# Patient Record
Sex: Male | Born: 1995
Health system: Southern US, Community
[De-identification: ages and names within clinical notes are randomized; demographics above are authoritative.]

## PROBLEM LIST (undated history)

## (undated) DIAGNOSIS — L309 Dermatitis, unspecified: Secondary | ICD-10-CM

## (undated) DIAGNOSIS — F909 Attention-deficit hyperactivity disorder, unspecified type: Secondary | ICD-10-CM

## (undated) DIAGNOSIS — J45909 Unspecified asthma, uncomplicated: Secondary | ICD-10-CM

## (undated) HISTORY — DX: Attention-deficit hyperactivity disorder, unspecified type: F90.9

---
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
## 2017-01-21 IMAGING — CR DG CHEST 2V
2 series · 2 of 2 positions shown · non-contrast
Comparison: Chest radiograph performed 04/19/2011

CLINICAL DATA: Acute onset of left-sided chest pain. Initial
encounter.

EXAM:
CHEST  2 VIEW

[chest pa]
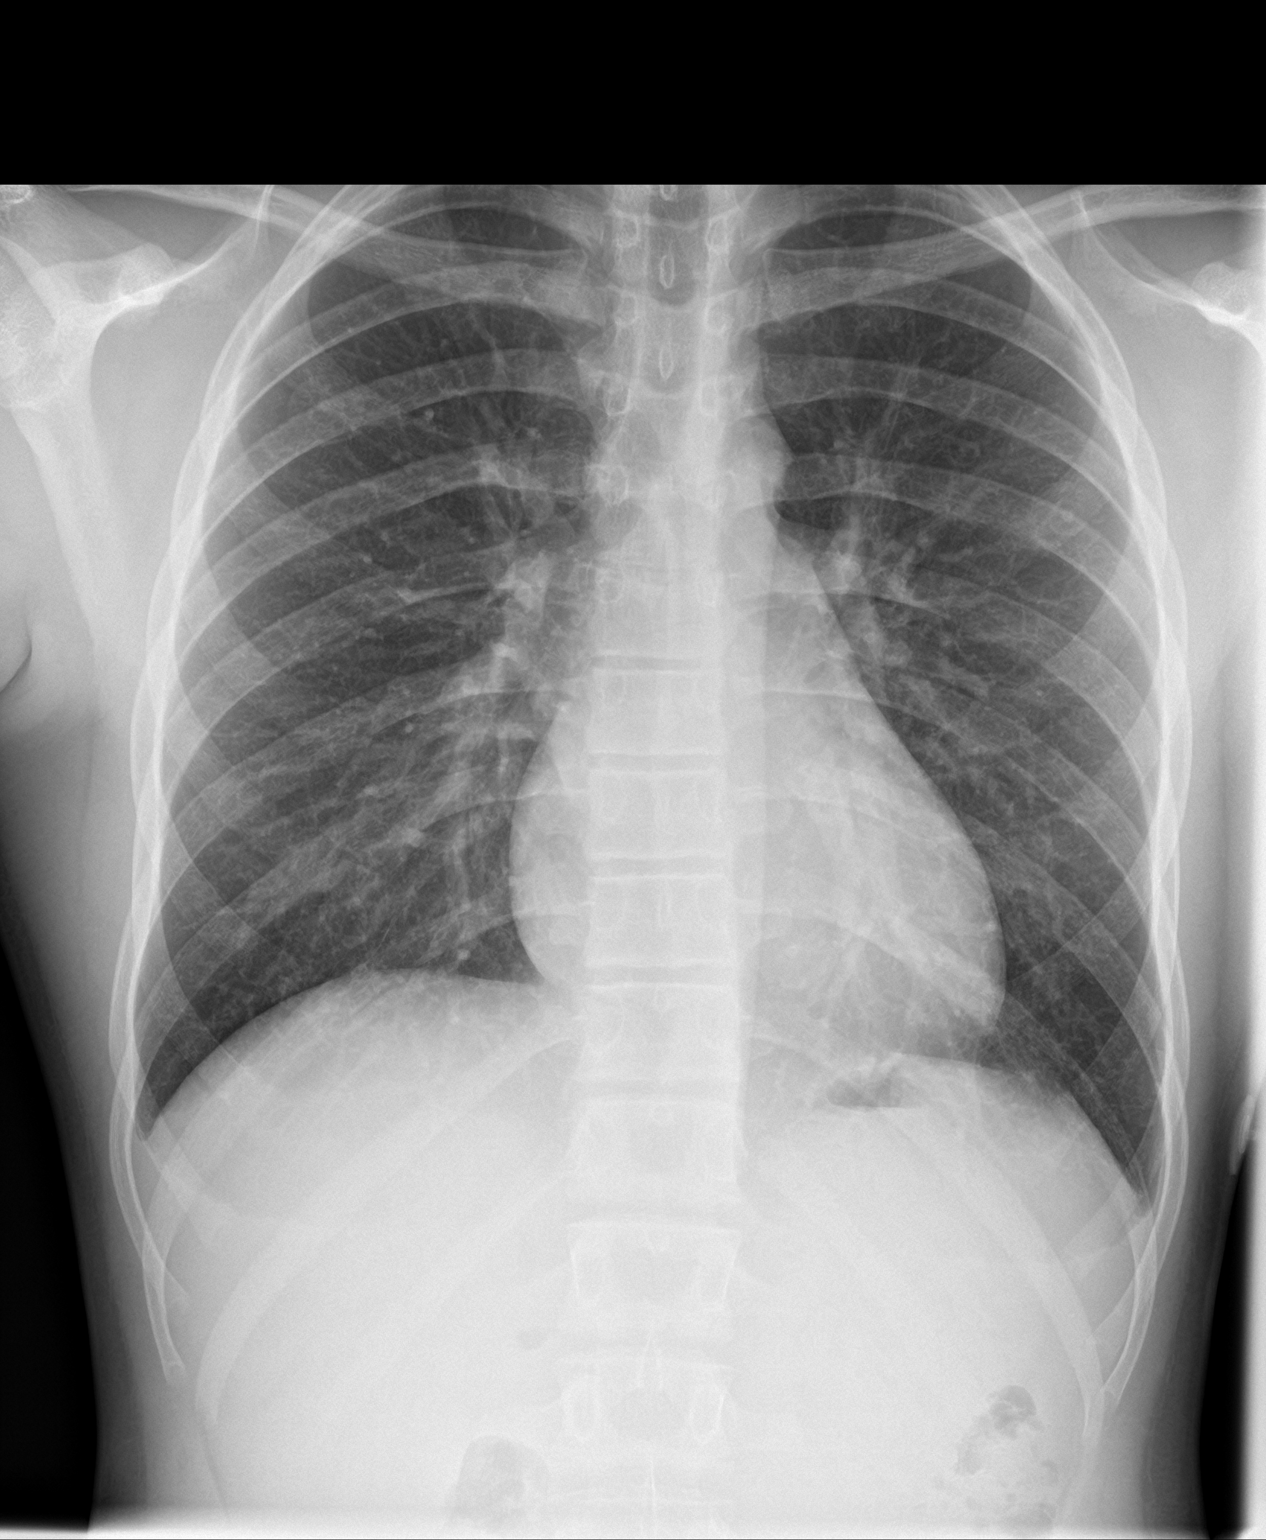

[chest lat]
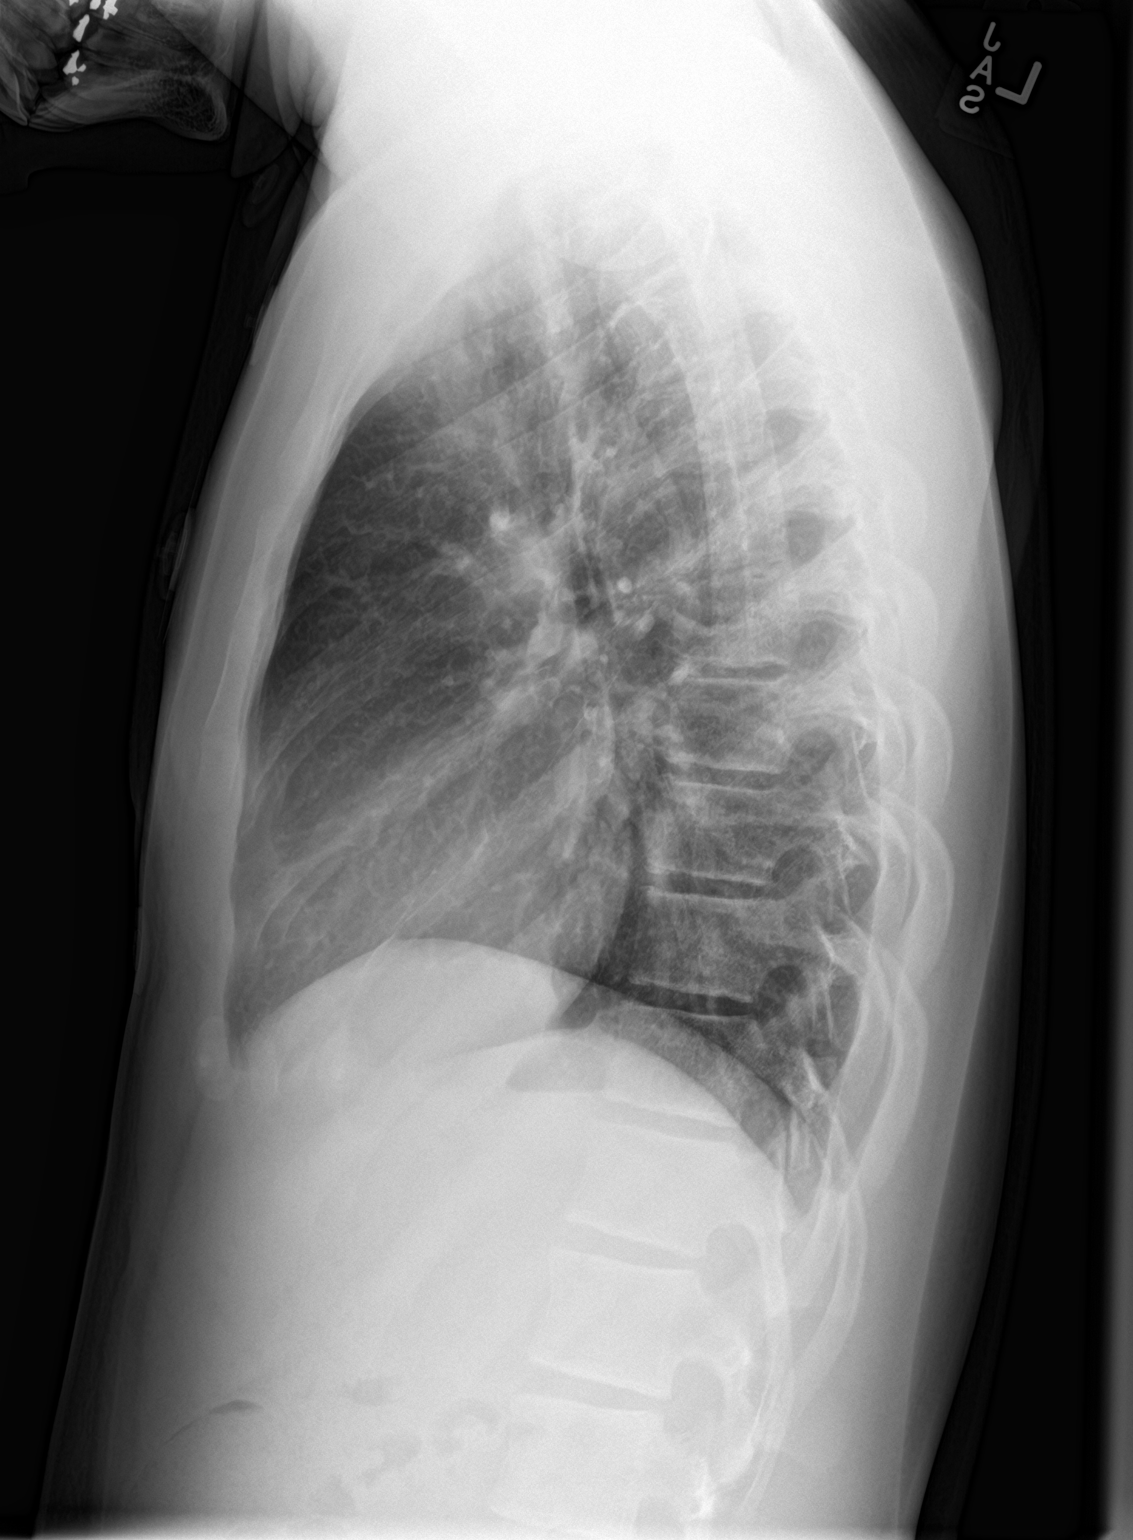

[2 of 2 positions shown; findings below may reference images not displayed]

FINDINGS: The lungs are well-aerated and clear. There is no evidence of focal
opacification, pleural effusion or pneumothorax.

The heart is normal in size; the mediastinal contour is within
normal limits. No acute osseous abnormalities are seen.
IMPRESSION: No acute cardiopulmonary process seen.

## 2017-01-26 DIAGNOSIS — Z23 Encounter for immunization: Secondary | ICD-10-CM | POA: Diagnosis not present

## 2017-01-26 DIAGNOSIS — L2084 Intrinsic (allergic) eczema: Secondary | ICD-10-CM | POA: Diagnosis not present

## 2017-01-26 DIAGNOSIS — Z0000 Encounter for general adult medical examination without abnormal findings: Secondary | ICD-10-CM | POA: Diagnosis not present

## 2017-02-26 DIAGNOSIS — Z23 Encounter for immunization: Secondary | ICD-10-CM | POA: Diagnosis not present

## 2017-04-16 DIAGNOSIS — J309 Allergic rhinitis, unspecified: Secondary | ICD-10-CM | POA: Diagnosis not present

## 2017-04-16 DIAGNOSIS — Z23 Encounter for immunization: Secondary | ICD-10-CM | POA: Diagnosis not present

## 2017-05-12 DIAGNOSIS — J309 Allergic rhinitis, unspecified: Secondary | ICD-10-CM | POA: Diagnosis not present

## 2017-06-08 DIAGNOSIS — Z973 Presence of spectacles and contact lenses: Secondary | ICD-10-CM | POA: Diagnosis not present

## 2017-06-08 DIAGNOSIS — H04123 Dry eye syndrome of bilateral lacrimal glands: Secondary | ICD-10-CM | POA: Diagnosis not present

## 2017-06-08 DIAGNOSIS — H5213 Myopia, bilateral: Secondary | ICD-10-CM | POA: Diagnosis not present

## 2017-08-11 DIAGNOSIS — J309 Allergic rhinitis, unspecified: Secondary | ICD-10-CM | POA: Diagnosis not present

## 2017-12-15 DIAGNOSIS — S6992XA Unspecified injury of left wrist, hand and finger(s), initial encounter: Secondary | ICD-10-CM | POA: Diagnosis not present

## 2017-12-15 DIAGNOSIS — M79645 Pain in left finger(s): Secondary | ICD-10-CM | POA: Diagnosis not present

## 2017-12-17 DIAGNOSIS — S6992XA Unspecified injury of left wrist, hand and finger(s), initial encounter: Secondary | ICD-10-CM | POA: Diagnosis not present

## 2018-02-25 DIAGNOSIS — Z23 Encounter for immunization: Secondary | ICD-10-CM | POA: Diagnosis not present

## 2018-02-25 DIAGNOSIS — Z6825 Body mass index (BMI) 25.0-25.9, adult: Secondary | ICD-10-CM | POA: Diagnosis not present

## 2018-05-06 DIAGNOSIS — Z139 Encounter for screening, unspecified: Secondary | ICD-10-CM | POA: Diagnosis not present

## 2018-05-06 DIAGNOSIS — Z23 Encounter for immunization: Secondary | ICD-10-CM | POA: Diagnosis not present

## 2018-05-06 DIAGNOSIS — Z6823 Body mass index (BMI) 23.0-23.9, adult: Secondary | ICD-10-CM | POA: Diagnosis not present

## 2018-05-06 DIAGNOSIS — Z118 Encounter for screening for other infectious and parasitic diseases: Secondary | ICD-10-CM | POA: Diagnosis not present

## 2018-05-27 DIAGNOSIS — Z6822 Body mass index (BMI) 22.0-22.9, adult: Secondary | ICD-10-CM | POA: Diagnosis not present

## 2018-05-27 DIAGNOSIS — Z0000 Encounter for general adult medical examination without abnormal findings: Secondary | ICD-10-CM | POA: Diagnosis not present

## 2018-06-10 DIAGNOSIS — Z973 Presence of spectacles and contact lenses: Secondary | ICD-10-CM | POA: Diagnosis not present

## 2018-06-10 DIAGNOSIS — H5213 Myopia, bilateral: Secondary | ICD-10-CM | POA: Diagnosis not present

## 2018-06-10 DIAGNOSIS — H04123 Dry eye syndrome of bilateral lacrimal glands: Secondary | ICD-10-CM | POA: Diagnosis not present

## 2018-07-01 DIAGNOSIS — Z23 Encounter for immunization: Secondary | ICD-10-CM | POA: Diagnosis not present

## 2018-07-01 DIAGNOSIS — Z0000 Encounter for general adult medical examination without abnormal findings: Secondary | ICD-10-CM | POA: Diagnosis not present

## 2018-07-01 DIAGNOSIS — Z6821 Body mass index (BMI) 21.0-21.9, adult: Secondary | ICD-10-CM | POA: Diagnosis not present

## 2018-07-08 DIAGNOSIS — M9901 Segmental and somatic dysfunction of cervical region: Secondary | ICD-10-CM | POA: Diagnosis not present

## 2018-07-08 DIAGNOSIS — R293 Abnormal posture: Secondary | ICD-10-CM | POA: Diagnosis not present

## 2018-07-08 DIAGNOSIS — M21752 Unequal limb length (acquired), left femur: Secondary | ICD-10-CM | POA: Diagnosis not present

## 2018-07-13 DIAGNOSIS — M21752 Unequal limb length (acquired), left femur: Secondary | ICD-10-CM | POA: Diagnosis not present

## 2018-07-13 DIAGNOSIS — R293 Abnormal posture: Secondary | ICD-10-CM | POA: Diagnosis not present

## 2018-07-13 DIAGNOSIS — M9901 Segmental and somatic dysfunction of cervical region: Secondary | ICD-10-CM | POA: Diagnosis not present

## 2018-10-14 DIAGNOSIS — J309 Allergic rhinitis, unspecified: Secondary | ICD-10-CM | POA: Diagnosis not present

## 2018-10-14 DIAGNOSIS — Z719 Counseling, unspecified: Secondary | ICD-10-CM | POA: Diagnosis not present

## 2019-01-13 ENCOUNTER — Other Ambulatory Visit: Payer: Self-pay

## 2019-01-13 DIAGNOSIS — Z20822 Contact with and (suspected) exposure to covid-19: Secondary | ICD-10-CM

## 2019-01-17 LAB — NOVEL CORONAVIRUS, NAA: SARS-CoV-2, NAA: NOT DETECTED

## 2019-01-24 ENCOUNTER — Telehealth: Payer: Self-pay

## 2019-01-24 NOTE — Telephone Encounter (Signed)
Called and informed patient that test for Covid 19 was NEGATIVE. Discussed signs and symptoms of Covid 19 : fever, chills, respiratory symptoms, cough, ENT symptoms, sore throat, SOB, muscle pain, diarrhea, headache, loss of taste/smell, close exposure to COVID-19 patient. Pt instructed to call PCP if they develop the above signs and sx. Pt also instructed to call 911 if having respiratory issues/distress. Discussed MyChart enrollment. Pt verbalized understanding.  

## 2019-07-13 ENCOUNTER — Ambulatory Visit: Payer: 59 | Attending: Internal Medicine

## 2019-07-13 DIAGNOSIS — Z20822 Contact with and (suspected) exposure to covid-19: Secondary | ICD-10-CM

## 2019-07-14 LAB — NOVEL CORONAVIRUS, NAA: SARS-CoV-2, NAA: NOT DETECTED

## 2019-12-25 ENCOUNTER — Other Ambulatory Visit: Payer: Self-pay

## 2019-12-25 ENCOUNTER — Encounter (HOSPITAL_COMMUNITY): Payer: Self-pay

## 2019-12-25 ENCOUNTER — Ambulatory Visit (HOSPITAL_COMMUNITY)
Admission: EM | Admit: 2019-12-25 | Discharge: 2019-12-25 | Disposition: A | Discharge status: Home / Self Care | Payer: 59

## 2019-12-25 DIAGNOSIS — M25561 Pain in right knee: Secondary | ICD-10-CM

## 2019-12-25 HISTORY — DX: Attention-deficit hyperactivity disorder, unspecified type: F90.9

## 2019-12-25 NOTE — ED Provider Notes (Signed)
[facility]   [id number] 12/25/19 Arrival Time: 1319  GM:WNUUV PAIN  SUBJECTIVE: History from: patient. Jason May is a 24 y.o. male complains of right knee pain that began about 3 weeks ago.  Reports that the right knee was swollen, and that it hurt to bear weight when the injury first occurred.  Reports that he is been using ibuprofen with little relief.  Reports that he hears popping in the knee when he is getting up or sitting down.  Reports that lateral movements are painful as well.  Reports that he was playing basketball and noticed once he was done.  Denies a specific movement or time where the pain began Denies fever, chills, erythema, ecchymosis, effusion, weakness, numbness and tingling, saddle paresthesias, loss of bowel or bladder function.      ROS: As per HPI.  All other pertinent ROS negative.     Past Medical History:  Diagnosis Date  . ADHD   . Asthma   . Eczema    History reviewed. No pertinent surgical history. No Known Allergies No current facility-administered medications on file prior to encounter.   Current Outpatient Medications on File Prior to Encounter  Medication Sig Dispense Refill  . amphetamine-dextroamphetamine (ADDERALL XR) 20 MG 24 hr capsule Take by mouth.    Marland Kitchen omeprazole (PRILOSEC) 20 MG capsule Take 1 capsule (20 mg total) by mouth daily. 14 capsule 0   Social History   Socioeconomic History  . Marital status: Single    Spouse name: Not on file  . Number of children: Not on file  . Years of education: Not on file  . Highest education level: Not on file  Occupational History  . Not on file  Tobacco Use  . Smoking status: Never Smoker  . Smokeless tobacco: Never Used  Vaping Use  . Vaping Use: Never used  Substance and Sexual Activity  . Alcohol use: Yes    Comment: occasional  . Drug use: Yes    Types: Marijuana  . Sexual activity: Yes    Birth control/protection: Condom  Other Topics Concern  . Not on file    Social History Narrative  . Not on file   Social Determinants of Health   Financial Resource Strain:   . Difficulty of Paying Living Expenses:   Food Insecurity:   . Worried About Programme researcher, broadcasting/film/video in the Last Year:   . Barista in the Last Year:   Transportation Needs:   . Freight forwarder (Medical):   Marland Kitchen Lack of Transportation (Non-Medical):   Physical Activity:   . Days of Exercise per Week:   . Minutes of Exercise per Session:   Stress:   . Feeling of Stress :   Social Connections:   . Frequency of Communication with Friends and Family:   . Frequency of Social Gatherings with Friends and Family:   . Attends Religious Services:   . Active Member of Clubs or Organizations:   . Attends Banker Meetings:   Marland Kitchen Marital Status:   Intimate Partner Violence:   . Fear of Current or Ex-Partner:   . Emotionally Abused:   Marland Kitchen Physically Abused:   . Sexually Abused:    Family History  Problem Relation Age of Onset  . Hypertension Mother   . Diabetes Mother   . Hypertension Father     OBJECTIVE:  Vitals:   12/25/19 1356  BP: 116/69  Pulse: 65  Resp: 16  Temp: 98.5 F (  36.9 C)  TempSrc: Oral  SpO2: 100%    General appearance: ALERT; in no acute distress.  Head: NCAT Lungs: Normal respiratory effort CV: pedal pulses 2+ bilaterally. Cap refill < 2 seconds Musculoskeletal:  Inspection: Skin warm, dry, clear and intact without obvious erythema, effusion, or ecchymosis.  Palpation: Nontender to palpation ROM: FROM active and passive Skin: warm and dry Neurologic: Ambulates without difficulty; Sensation intact about the upper/ lower extremities Psychological: alert and cooperative; normal mood and affect  DIAGNOSTIC STUDIES:  No results found.   ASSESSMENT & PLAN:  1. Acute pain of right knee     No orders of the defined types were placed in this encounter.  Suspect a soft tissue injury, follow-up with orthopedics Continue conservative  management of rest, ice, and gentle stretches Take naproxen as needed for pain relief (may cause abdominal discomfort, ulcers, and GI bleeds avoid taking with other NSAIDs) Follow up with PCP if symptoms persist Return or go to the ER if you have any new or worsening symptoms (fever, chills, chest pain, abdominal pain, changes in bowel or bladder habits, pain radiating into lower legs)   Reviewed expectations re: course of current medical issues. Questions answered. Outlined signs and symptoms indicating need for more acute intervention. Patient verbalized understanding. After Visit Summary given.       Moshe Cipro, NP 12/25/19 1444

## 2019-12-25 NOTE — ED Triage Notes (Signed)
Pt c/o right knee pain for approx 3 weeks after playing basketball. Pt states he rested knee/ice therapy for several days, but pain increased yesterday after physical activity.  Specifies pain to anterior/central knee and posterior fossa of right knee. N/V intact.  No NSAIDs/tylenol taken recently for pain

## 2019-12-25 NOTE — Discharge Instructions (Signed)
I suspect that you have a soft tissue injury  We will get you a sleeve for your knee  Follow-up with orthopedics
